# Patient Record
Sex: Male | Born: 1995
Health system: Southern US, Community
[De-identification: ages and names within clinical notes are randomized; demographics above are authoritative.]

## PROBLEM LIST (undated history)

## (undated) DIAGNOSIS — Z8669 Personal history of other diseases of the nervous system and sense organs: Secondary | ICD-10-CM

## (undated) DIAGNOSIS — J189 Pneumonia, unspecified organism: Secondary | ICD-10-CM

## (undated) DIAGNOSIS — K219 Gastro-esophageal reflux disease without esophagitis: Secondary | ICD-10-CM

## (undated) DIAGNOSIS — R519 Headache, unspecified: Secondary | ICD-10-CM

## (undated) DIAGNOSIS — F419 Anxiety disorder, unspecified: Secondary | ICD-10-CM

## (undated) DIAGNOSIS — N200 Calculus of kidney: Secondary | ICD-10-CM

## (undated) DIAGNOSIS — G8929 Other chronic pain: Secondary | ICD-10-CM

## (undated) HISTORY — DX: Calculus of kidney: N20.0

## (undated) HISTORY — DX: Headache, unspecified: R51.9

## (undated) HISTORY — DX: Other chronic pain: G89.29

## (undated) HISTORY — DX: Pneumonia, unspecified organism: J18.9

## (undated) HISTORY — DX: Anxiety disorder, unspecified: F41.9

## (undated) HISTORY — PX: APPENDECTOMY: SHX54

---
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
## 2015-11-23 DIAGNOSIS — M7551 Bursitis of right shoulder: Secondary | ICD-10-CM | POA: Insufficient documentation

## 2016-05-05 DIAGNOSIS — M958 Other specified acquired deformities of musculoskeletal system: Secondary | ICD-10-CM | POA: Insufficient documentation

## 2020-08-28 ENCOUNTER — Emergency Department: Admit: 2020-08-28 | Discharge status: Absent without leave | Payer: Self-pay

## 2020-08-28 ENCOUNTER — Encounter: Payer: Self-pay | Admitting: Emergency Medicine

## 2020-08-28 ENCOUNTER — Emergency Department (INDEPENDENT_AMBULATORY_CARE_PROVIDER_SITE_OTHER)
Admission: EM | Admit: 2020-08-28 | Discharge: 2020-08-28 | Disposition: A | Discharge status: Home / Self Care | Payer: Federal, State, Local not specified - PPO | Source: Home / Self Care

## 2020-08-28 ENCOUNTER — Emergency Department (INDEPENDENT_AMBULATORY_CARE_PROVIDER_SITE_OTHER): Payer: Federal, State, Local not specified - PPO

## 2020-08-28 ENCOUNTER — Other Ambulatory Visit: Payer: Self-pay

## 2020-08-28 DIAGNOSIS — R079 Chest pain, unspecified: Secondary | ICD-10-CM

## 2020-08-28 HISTORY — DX: Personal history of other diseases of the nervous system and sense organs: Z86.69

## 2020-08-28 NOTE — ED Triage Notes (Signed)
Rib pain x 4 weeks  Left side of chest intermittent pain  Unable to describe pain Thought it might be reflux - took OTC ( not sure what it was- helped, but stopped taking it 1 week ago - took yesterday - no relief) TUMS helped heartburn per pt  NO COVID or FLU vaccine

## 2020-08-28 NOTE — ED Provider Notes (Signed)
[facility]    CSN: [id number] Arrival date & time: 08/28/20  1123      History   Chief Complaint Chief Complaint  Patient presents with  . Chest Pain  . Heartburn    HPI Mark Erickson is a 25 y.o. male.   HPI  Mark Erickson is a 25 y.o. male presenting to UC with c/o Left side chest pain that started in his ribs but states it is difficult for him to describe the pain as it has been aching, dull, sharp and burning at different times over the last 4 weeks.  Pain lasts seconds to minutes at a time, radiates into Left arm at times.  Mild relief with OTC reflux medication.  He has taken TUMS, mild relief.  Denies fever, chills, n/v/d. No cough or SOB. No hx of heart problems that he recalls.    Past Medical History:  Diagnosis Date  . History of migraine     Patient Active Problem List   Diagnosis Date Noted  . Winging of scapula 05/05/2016  . Bursitis of right shoulder 11/23/2015  . Umbilical mass 03/07/2013  . Status post laparoscopic appendectomy 02/03/2013  . Attention deficit hyperactivity disorder (ADHD) 01/03/2010    Past Surgical History:  Procedure Laterality Date  . APPENDECTOMY         Home Medications    Prior to Admission medications   Not on File    Family History Family History  Problem Relation Age of Onset  . Healthy Mother   . Healthy Father   . Healthy Sister   . Healthy Brother     Social History Social History   Tobacco Use  . Smoking status: Never Smoker  . Smokeless tobacco: Current User    Types: Chew  . Tobacco comment: 13 years chewing tobacco  Vaping Use  . Vaping Use: Never used  Substance Use Topics  . Alcohol use: Not Currently  . Drug use: Never     Allergies   Patient has no known allergies.   Review of Systems Review of Systems  Constitutional: Negative for chills and fever.  HENT: Negative for congestion, ear pain, sore throat, trouble swallowing and voice change.   Respiratory: Negative  for cough and shortness of breath.   Cardiovascular: Positive for chest pain. Negative for palpitations.  Gastrointestinal: Positive for abdominal pain. Negative for diarrhea, nausea and vomiting.  Musculoskeletal: Positive for myalgias (left upper arm). Negative for arthralgias and back pain.  Skin: Negative for rash.  All other systems reviewed and are negative.    Physical Exam Triage Vital Signs ED Triage Vitals  Enc Vitals Group     BP 08/28/20 1146 136/81     Pulse Rate 08/28/20 1146 71     Resp 08/28/20 1146 16     Temp 08/28/20 1146 99.5 F (37.5 C)     Temp Source 08/28/20 1146 Oral     SpO2 08/28/20 1146 99 %     Weight 08/28/20 1147 143 lb (64.9 kg)     Height 08/28/20 1147 5\' 10"  (1.778 m)     Head Circumference --      Peak Flow --      Pain Score 08/28/20 1148 2     Pain Loc --      Pain Edu? --      Excl. in GC? --    No data found.  Updated Vital Signs BP 136/81 (BP Location: Left Arm)   Pulse 71   Temp  99.5 F (37.5 C) (Oral)   Resp 16   Ht 5\' 10"  (1.778 m)   Wt 143 lb (64.9 kg)   SpO2 99%   BMI 20.52 kg/m   Visual Acuity Right Eye Distance:   Left Eye Distance:   Bilateral Distance:    Right Eye Near:   Left Eye Near:    Bilateral Near:     Physical Exam Vitals and nursing note reviewed.  Constitutional:      General: He is not in acute distress.    Appearance: He is well-developed and well-nourished. He is not ill-appearing, toxic-appearing or diaphoretic.  HENT:     Head: Normocephalic and atraumatic.  Eyes:     Extraocular Movements: EOM normal.  Cardiovascular:     Rate and Rhythm: Normal rate and regular rhythm.  Pulmonary:     Effort: Pulmonary effort is normal.     Breath sounds: No decreased breath sounds, wheezing, rhonchi or rales.  Chest:     Chest wall: No tenderness or crepitus.  Abdominal:     Palpations: Abdomen is soft.     Tenderness: There is no abdominal tenderness.  Musculoskeletal:        General: Normal  range of motion.     Cervical back: Normal range of motion.  Skin:    General: Skin is warm and dry.  Neurological:     Mental Status: He is alert and oriented to person, place, and time.  Psychiatric:        Mood and Affect: Mood and affect normal.        Behavior: Behavior normal.      UC Treatments / Results  Labs (all labs ordered are listed, but only abnormal results are displayed) Labs Reviewed - No data to display  EKG Date/Time:08/28/2020   12:42:33 Ventricular Rate: 61 PR Interval: 178 QRS Duration: 102 QT Interval: 382 QTC Calculation: 384 P-R-T axes: 76   94   79 Text Interpretation: Normal sinus rhythm. Rightward axis. Borderline ECG no prior to compare.    Radiology DG Chest 2 View  Result Date: 08/28/2020 CLINICAL DATA:  Left-sided chest pain. EXAM: CHEST - 2 VIEW COMPARISON:  None. FINDINGS: The heart size and mediastinal contours are within normal limits. Both lungs are clear. The visualized skeletal structures are unremarkable. IMPRESSION: No active cardiopulmonary disease. Electronically Signed   By: 10/26/2020 III M.D   On: 08/28/2020 12:16    Procedures Procedures (including critical care time)  Medications Ordered in UC Medications - No data to display  Initial Impression / Assessment and Plan / UC Course  I have reviewed the triage vital signs and the nursing notes.  Pertinent labs & imaging results that were available during my care of the patient were reviewed by me and considered in my medical decision making (see chart for details).     No evidence of ACS at this time or acute process at this time. Encouraged f/u with PCP  Discussed symptoms that warrant emergent care in the ED.  Final Clinical Impressions(s) / UC Diagnoses   Final diagnoses:  Nonspecific chest pain     Discharge Instructions      You may take 500mg  acetaminophen every 4-6 hours or in combination with ibuprofen 400-600mg  every 6-8 hours as needed for  pain.  Call to schedule an appointment with primary care for ongoing healthcare needs and recheck of symptoms next week.   Call 911 or have someone drive you to the hospital if symptoms significantly  worsening- worsening pain, trouble breathing, unable to keep down fluids, dizziness/passing out, or other new concerning symptoms develop.      ED Prescriptions    None     PDMP not reviewed this encounter.   Lurene Shadow, [place] 08/29/20 1433

## 2020-08-28 NOTE — Discharge Instructions (Signed)
  You may take 500mg  acetaminophen every 4-6 hours or in combination with ibuprofen 400-600mg  every 6-8 hours as needed for pain.  Call to schedule an appointment with primary care for ongoing healthcare needs and recheck of symptoms next week.   Call 911 or have someone drive you to the hospital if symptoms significantly worsening- worsening pain, trouble breathing, unable to keep down fluids, dizziness/passing out, or other new concerning symptoms develop.

## 2020-09-22 ENCOUNTER — Emergency Department
Admission: RE | Admit: 2020-09-22 | Discharge: 2020-09-22 | Disposition: A | Discharge status: Home / Self Care | Payer: Federal, State, Local not specified - PPO | Source: Ambulatory Visit

## 2020-09-22 ENCOUNTER — Encounter: Payer: Self-pay | Admitting: Gastroenterology

## 2020-09-22 ENCOUNTER — Other Ambulatory Visit: Payer: Self-pay

## 2020-09-22 VITALS — BP 126/79 | HR 76 | Temp 98.8°F | Resp 18 | Ht 70.0 in | Wt 135.0 lb

## 2020-09-22 DIAGNOSIS — K2100 Gastro-esophageal reflux disease with esophagitis, without bleeding: Secondary | ICD-10-CM | POA: Diagnosis not present

## 2020-09-22 MED ORDER — FAMOTIDINE 40 MG PO TABS: 40.0000 mg | ORAL_TABLET | Freq: Two times a day (BID) | ORAL | 0 refills | Status: AC

## 2020-09-22 NOTE — Discharge Instructions (Signed)
Schedule follow-up with gastroenterology next available appointment for further work-up and evaluation of your esophagitis and acid reflux symptoms.  Take medication as prescribed as needed for esophagitis symptoms.  I have also included some food choices to help reduce symptoms related to GERD. Also recommend scheduling a new patient appointment for primary care provider.

## 2020-09-22 NOTE — ED Triage Notes (Signed)
Epigastric pain x 5 months, throat is swelling, causing him to gage and vomit. Notices bad reaction to drinking energy drinks and soda. Tries omeprazole but can not remember to take it everyday.

## 2020-09-22 NOTE — ED Provider Notes (Signed)
[facility]    CSN: [id number] Arrival date & time: 09/22/20  0913      History   Chief Complaint Chief Complaint  Patient presents with  . Abdominal Pain    HPI Mark Erickson is a 25 y.o. male.   HPI Patient presents with a concern of epigastric pain ongoing intermittently for over 5 months.  He is also concerned as he has had some dysphagia type symptoms and this is most pronounced after drinking certain beverages.  He has attempted relief with Tums and OTC PPIs without significant relief however endorses inconsistent adherence to taking the medication.  No prior GI follow-up.  At present patient is not followed by PCP.  He does not smoke however he has a long history of chewing.  He reports most of his dysphagia episodes occur with him holding his head backwards or with attempting to lie down.  He has had some vomitus which is a new symptom occurring this week.  No overt abdominal pain mostly epigastric discomfort. Past Medical History:  Diagnosis Date  . History of migraine     Patient Active Problem List   Diagnosis Date Noted  . Winging of scapula 05/05/2016  . Bursitis of right shoulder 11/23/2015  . Umbilical mass 03/07/2013  . Status post laparoscopic appendectomy 02/03/2013  . Attention deficit hyperactivity disorder (ADHD) 01/03/2010    Past Surgical History:  Procedure Laterality Date  . APPENDECTOMY         Home Medications    Prior to Admission medications   Not on File    Family History Family History  Problem Relation Age of Onset  . Healthy Mother   . Healthy Father   . Healthy Sister   . Healthy Brother     Social History Social History   Tobacco Use  . Smoking status: Never Smoker  . Smokeless tobacco: Current User    Types: Chew  . Tobacco comment: 13 years chewing tobacco  Vaping Use  . Vaping Use: Never used  Substance Use Topics  . Alcohol use: Not Currently  . Drug use: Never     Allergies   Patient has no  known allergies.   Review of Systems Review of Systems Pertinent negatives listed in HPI   Physical Exam Triage Vital Signs ED Triage Vitals  Enc Vitals Group     BP 09/22/20 0933 126/79     Pulse Rate 09/22/20 0933 76     Resp 09/22/20 0933 18     Temp 09/22/20 0933 98.8 F (37.1 C)     Temp Source 09/22/20 0933 Oral     SpO2 09/22/20 0933 97 %     Weight 09/22/20 0935 135 lb (61.2 kg)     Height 09/22/20 0935 5\' 10"  (1.778 m)     Head Circumference --      Peak Flow --      Pain Score 09/22/20 0934 7     Pain Loc --      Pain Edu? --      Excl. in GC? --    No data found.  Updated Vital Signs BP 126/79 (BP Location: Right Arm)   Pulse 76   Temp 98.8 F (37.1 C) (Oral)   Resp 18   Ht 5\' 10"  (1.778 m)   Wt 135 lb (61.2 kg)   SpO2 97%   BMI 19.37 kg/m   Visual Acuity Right Eye Distance:   Left Eye Distance:   Bilateral Distance:  Right Eye Near:   Left Eye Near:    Bilateral Near:     Physical Exam General appearance: alert, well developed, well nourished, cooperativeHead: Normocephalic, without obvious abnormality, atraumatic Respiratory: Respirations even and unlabored, normal respiratory rate Heart: rate and rhythm normal. No gallop or murmurs noted on exam  Abdomen: BS +, no distention, no rebound tenderness, or no mass Extremities: No gross deformities Skin: Skin color, texture, turgor normal. No rashes seen  Psych: Appropriate mood and affect. Neurologic: Alert, oriented to person, place, and time, thought content appropriate.  UC Treatments / Results  Labs (all labs ordered are listed, but only abnormal results are displayed) Labs Reviewed - No data to display  EKG   Radiology No results found.  Procedures Procedures (including critical care time)  Medications Ordered in UC Medications - No data to display  Initial Impression / Assessment and Plan / UC Course  I have reviewed the triage vital signs and the nursing  notes.  Pertinent labs & imaging results that were available during my care of the patient were reviewed by me and considered in my medical decision making (see chart for details).    Patient with a history of recurrent ongoing esophagitis and GERD symptoms. Has not consistently adhere to PPI therapy given new symptoms of worsening esophagitis however will trial as needed famotidine 40 mg twice daily until esophagitis symptoms resolve.  Patient given 2 referrals to follow-up with gastroenterology within the [facility] and in a GI office here locally in [place] as patient is unaware of who his insurance permits him to see.  Patient will follow up with both and schedule appointment with whichever provider is available to see him first. Information given regarding foods to avoid to prevent recurrence of symptoms. Final Clinical Impressions(s) / UC Diagnoses   Final diagnoses:  Gastroesophageal reflux disease with esophagitis without hemorrhage     Discharge Instructions     Schedule follow-up with gastroenterology next available appointment for further work-up and evaluation of your esophagitis and acid reflux symptoms.  Take medication as prescribed as needed for esophagitis symptoms.  I have also included some food choices to help reduce symptoms related to GERD. Also recommend scheduling a new patient appointment for primary care provider.    ED Prescriptions    Medication Sig Dispense Auth. Provider   famotidine (PEPCID) 40 MG tablet Take 1 tablet (40 mg total) by mouth 2 (two) times daily. 60 tablet Bing Neighbors, FNP     PDMP not reviewed this encounter.   Bing Neighbors, FNP 09/22/20 1019

## 2020-10-07 ENCOUNTER — Ambulatory Visit: Payer: Federal, State, Local not specified - PPO | Admitting: Gastroenterology

## 2020-10-07 ENCOUNTER — Encounter: Payer: Self-pay | Admitting: Gastroenterology

## 2020-10-07 VITALS — BP 108/64 | HR 90 | Ht 70.0 in | Wt 138.1 lb

## 2020-10-07 DIAGNOSIS — R12 Heartburn: Secondary | ICD-10-CM | POA: Diagnosis not present

## 2020-10-07 DIAGNOSIS — R1013 Epigastric pain: Secondary | ICD-10-CM | POA: Diagnosis not present

## 2020-10-07 DIAGNOSIS — K219 Gastro-esophageal reflux disease without esophagitis: Secondary | ICD-10-CM

## 2020-10-07 NOTE — Patient Instructions (Signed)
If you are age 26 or older, your body mass index should be between 23-30. Your Body mass index is 19.82 kg/m. If this is out of the aforementioned range listed, please consider follow up with your Primary Care Provider.  If you are age 25 or younger, your body mass index should be between 19-25. Your Body mass index is 19.82 kg/m. If this is out of the aformentioned range listed, please consider follow up with your Primary Care Provider.   Continue with Pepcid twice daily for 4-6 weeks then decrease to once daily   Gastroesophageal Reflux Disease, Adult  Gastroesophageal reflux (GER) happens when acid from the stomach flows up into the tube that connects the mouth and the stomach (esophagus). Normally, food travels down the esophagus and stays in the stomach to be digested. With GER, food and stomach acid sometimes move back up into the esophagus. You may have a disease called gastroesophageal reflux disease (GERD) if the reflux:  Happens often.  Causes frequent or very bad symptoms.  Causes problems such as damage to the esophagus. When this happens, the esophagus becomes sore and swollen. Over time, GERD can make small holes (ulcers) in the lining of the esophagus. What are the causes? This condition is caused by a problem with the muscle between the esophagus and the stomach. When this muscle is weak or not normal, it does not close properly to keep food and acid from coming back up from the stomach. The muscle can be weak because of:  Tobacco use.  Pregnancy.  Having a certain type of hernia (hiatal hernia).  Alcohol use.  Certain foods and drinks, such as coffee, chocolate, onions, and peppermint. What increases the risk?  Being overweight.  Having a disease that affects your connective tissue.  Taking NSAIDs, such a ibuprofen. What are the signs or symptoms?  Heartburn.  Difficult or painful swallowing.  The feeling of having a lump in the throat.  A bitter taste  in the mouth.  Bad breath.  Having a lot of saliva.  Having an upset or bloated stomach.  Burping.  Chest pain. Different conditions can cause chest pain. Make sure you see your doctor if you have chest pain.  Shortness of breath or wheezing.  A long-term cough or a cough at night.  Wearing away of the surface of teeth (tooth enamel).  Weight loss. How is this treated?  Making changes to your diet.  Taking medicine.  Having surgery. Treatment will depend on how bad your symptoms are. Follow these instructions at home: Eating and drinking  Follow a diet as told by your doctor. You may need to avoid foods and drinks such as: ? Coffee and tea, with or without caffeine. ? Drinks that contain alcohol. ? Energy drinks and sports drinks. ? Bubbly (carbonated) drinks or sodas. ? Chocolate and cocoa. ? Peppermint and mint flavorings. ? Garlic and onions. ? Horseradish. ? Spicy and acidic foods. These include peppers, chili powder, curry powder, vinegar, hot sauces, and BBQ sauce. ? Citrus fruit juices and citrus fruits, such as oranges, lemons, and limes. ? Tomato-based foods. These include red sauce, chili, salsa, and pizza with red sauce. ? Fried and fatty foods. These include donuts, french fries, potato chips, and high-fat dressings. ? High-fat meats. These include hot dogs, rib eye steak, sausage, ham, and bacon. ? High-fat dairy items, such as whole milk, butter, and cream cheese.  Eat small meals often. Avoid eating large meals.  Avoid drinking large amounts of  liquid with your meals.  Avoid eating meals during the 2-3 hours before bedtime.  Avoid lying down right after you eat.  Do not exercise right after you eat.   Lifestyle  Do not smoke or use any products that contain nicotine or tobacco. If you need help quitting, ask your doctor.  Try to lower your stress. If you need help doing this, ask your doctor.  If you are overweight, lose an amount of weight  that is healthy for you. Ask your doctor about a safe weight loss goal.   General instructions  Pay attention to any changes in your symptoms.  Take over-the-counter and prescription medicines only as told by your doctor.  Do not take aspirin, ibuprofen, or other NSAIDs unless your doctor says it is okay.  Wear loose clothes. Do not wear anything tight around your waist.  Raise (elevate) the head of your bed about 6 inches (15 cm). You may need to use a wedge to do this.  Avoid bending over if this makes your symptoms worse.  Keep all follow-up visits. Contact a doctor if:  You have new symptoms.  You lose weight and you do not know why.  You have trouble swallowing or it hurts to swallow.  You have wheezing or a cough that keeps happening.  You have a hoarse voice.  Your symptoms do not get better with treatment. Get help right away if:  You have sudden pain in your arms, neck, jaw, teeth, or back.  You suddenly feel sweaty, dizzy, or light-headed.  You have chest pain or shortness of breath.  You vomit and the vomit is green, yellow, or black, or it looks like blood or coffee grounds.  You faint.  Your poop (stool) is red, bloody, or black.  You cannot swallow, drink, or eat. These symptoms may represent a serious problem that is an emergency. Do not wait to see if the symptoms will go away. Get medical help right away. Call your local emergency services (911 in the [place] Do not drive yourself to the hospital. Summary  If a person has gastroesophageal reflux disease (GERD), food and stomach acid move back up into the esophagus and cause symptoms or problems such as damage to the esophagus.  Treatment will depend on how bad your symptoms are.  Follow a diet as told by your doctor.  Take all medicines only as told by your doctor. This information is not intended to replace advice given to you by your health care provider. Make sure you discuss any questions you  have with your health care provider. Document Revised: 02/16/2020 Document Reviewed: 02/16/2020 Elsevier Patient Education  2021 Elsevier Inc.  Food Choices for Gastroesophageal Reflux Disease, Adult When you have gastroesophageal reflux disease (GERD), the foods you eat and your eating habits are very important. Choosing the right foods can help ease your discomfort. Think about working with a food expert (dietitian) to help you make good choices. What are tips for following this plan? Reading food labels  Look for foods that are low in saturated fat. Foods that may help with your symptoms include: ? Foods that have less than 5% of daily value (DV) of fat. ? Foods that have 0 grams of trans fat. Cooking  Do not fry your food.  Cook your food by baking, steaming, grilling, or broiling. These are all methods that do not need a lot of fat for cooking.  To add flavor, try to use herbs that are low in  spice and acidity. Meal planning  Choose healthy foods that are low in fat, such as: ? Fruits and vegetables. ? Whole grains. ? Low-fat dairy products. ? Lean meats, fish, and poultry.  Eat small meals often instead of eating 3 large meals each day. Eat your meals slowly in a place where you are relaxed. Avoid bending over or lying down until 2-3 hours after eating.  Limit high-fat foods such as fatty meats or fried foods.  Limit your intake of fatty foods, such as oils, butter, and shortening.  Avoid the following as told by your doctor: ? Foods that cause symptoms. These may be different for different people. Keep a food diary to keep track of foods that cause symptoms. ? Alcohol. ? Drinking a lot of liquid with meals. ? Eating meals during the 2-3 hours before bed.   Lifestyle  Stay at a healthy weight. Ask your doctor what weight is healthy for you. If you need to lose weight, work with your doctor to do so safely.  Exercise for at least 30 minutes on 5 or more days each week,  or as told by your doctor.  Wear loose-fitting clothes.  Do not smoke or use any products that contain nicotine or tobacco. If you need help quitting, ask your doctor.  Sleep with the head of your bed higher than your feet. Use a wedge under the mattress or blocks under the bed frame to raise the head of the bed.  Chew sugar-free gum after meals. What foods should eat? Eat a healthy, well-balanced diet of fruits, vegetables, whole grains, low-fat dairy products, lean meats, fish, and poultry. Each person is different. Foods that may cause symptoms in one person may not cause any symptoms in another person. Work with your doctor to find foods that are safe for you. The items listed above may not be a complete list of what you can eat and drink. Contact a food expert for more options.   What foods should I avoid? Limiting some of these foods may help in managing the symptoms of GERD. Everyone is different. Talk with a food expert or your doctor to help you find the exact foods to avoid, if any. Fruits Any fruits prepared with added fat. Any fruits that cause symptoms. For some people, this may include citrus fruits, such as oranges, grapefruit, pineapple, and lemons. Vegetables Deep-fried vegetables. [place] fries. Any vegetables prepared with added fat. Any vegetables that cause symptoms. For some people, this may include tomatoes and tomato products, chili peppers, onions and garlic, and horseradish. Grains Pastries or quick breads with added fat. Meats and other proteins High-fat meats, such as fatty beef or pork, hot dogs, ribs, ham, sausage, salami, and bacon. Fried meat or protein, including fried fish and fried chicken. Nuts and nut butters, in large amounts. Dairy Whole milk and chocolate milk. Sour cream. Cream. Ice cream. Cream cheese. Milkshakes. Fats and oils Butter. Margarine. Shortening. Ghee. Beverages Coffee and tea, with or without caffeine. Carbonated beverages. Sodas.  Energy drinks. Fruit juice made with acidic fruits, such as orange or grapefruit. Tomato juice. Alcoholic drinks. Sweets and desserts Chocolate and cocoa. Donuts. Seasonings and condiments Pepper. Peppermint and spearmint. Added salt. Any condiments, herbs, or seasonings that cause symptoms. For some people, this may include curry, hot sauce, or vinegar-based salad dressings. The items listed above may not be a complete list of what you should not eat and drink. Contact a food expert for more options. Questions to ask your  doctor Diet and lifestyle changes are often the first steps that are taken to manage symptoms of GERD. If diet and lifestyle changes do not help, talk with your doctor about taking medicines. Where to find more information  International Foundation for Gastrointestinal Disorders: aboutgerd.org Summary  When you have GERD, food and lifestyle choices are very important in easing your symptoms.  Eat small meals often instead of 3 large meals a day. Eat your meals slowly and in a place where you are relaxed.  Avoid bending over or lying down until 2-3 hours after eating.  Limit high-fat foods such as fatty meats or fried foods. This information is not intended to replace advice given to you by your health care provider. Make sure you discuss any questions you have with your health care provider. Document Revised: 02/16/2020 Document Reviewed: 02/16/2020 Elsevier Patient Education  2021 [employer]   Due to recent changes in healthcare laws, you may see the results of your imaging and laboratory studies on MyChart before your provider has had a chance to review them.  We understand that in some cases there may be results that are confusing or concerning to you. Not all laboratory results come back in the same time frame and the provider may be waiting for multiple results in order to interpret others.  Please give us 48 hours in order for your provider to thoroughly review  all the results before contacting the office for clarification of your results.   Thank you for choosing me and [facility].  Doristine LocksVito Cirigliano, D.O.  Please call to schedule a return appointment in 3 months.

## 2020-10-07 NOTE — Progress Notes (Signed)
Chief Complaint: chest pain, epigastric pain, GERD  Referring Provider:     Bing Neighbors, FNP   HPI:     Mark Erickson is a 25 y.o. male referred to the [facility] for evaluation of chest pain and epigastric pain with concern for reflux.  Has been having intermittent epigastric pain and chest pain for the last 5+ months.  Rare heartburn and no regurgitation.  Does have intermittent globus sensation, pointing to suprasternal notch.  No frank dysphagia or odynophagia.  Mild relief with trial of Tums and OTC Nexium. Also anytime he extends his neck will have gagging and n/v.   Seen in [facility] 08/28/20 for chest pain and heartburn.  EKG unremarkable.  Normal CXR.  Was seen again in the [facility] on 09/22/2020 with ongoing epigastric pain.  Work-up was unremarkable.  Prescribed famotidine 40 mg bid with GI referral.  He returns to [facility] on 09/29/2020 with nephrolithiasis.  Normal CBC, CMP.  CT abdomen pelvis with 2 mm left ureteral stone causing left hydronephrosis and perinephric stranding.  Otherwise normal appearing GI tract, liver, GB, pancreas. Discharged with Flomax, Zofran, Norco, and Urology follow-up.   Today, he states symptoms are overall improving since starting famotidine.  Tolerating p.o. intake.  No abdominal pain, change in bowel habits, hematochezia, melena.  No known family history of CRC, GI malignancy, liver disease, pancreatic disease, or IBD.   No prior EGD/colonoscopy.    Past Medical History:  Diagnosis Date  . Anxiety   . Chronic headache   . History of migraine   . Kidney stone   . Pneumonia      Past Surgical History:  Procedure Laterality Date  . APPENDECTOMY     Family History  Problem Relation Age of Onset  . Healthy Mother   . Healthy Father   . Healthy Sister   . Healthy Brother   . Colon cancer Neg Hx   . Esophageal cancer Neg Hx    Social History   Tobacco Use  . Smoking  status: Never Smoker  . Smokeless tobacco: Current User    Types: Chew  . Tobacco comment: 13 years chewing tobacco  Vaping Use  . Vaping Use: Never used  Substance Use Topics  . Alcohol use: Not Currently  . Drug use: Never   Current Outpatient Medications  Medication Sig Dispense Refill  . famotidine (PEPCID) 40 MG tablet Take 1 tablet (40 mg total) by mouth 2 (two) times daily. 60 tablet 0   No current facility-administered medications for this visit.   No Known Allergies   Review of Systems: All systems reviewed and negative except where noted in HPI.     Physical Exam:    Wt Readings from Last 3 Encounters:  10/07/20 138 lb 2 oz (62.7 kg)  09/22/20 135 lb (61.2 kg)  08/28/20 143 lb (64.9 kg)    BP 108/64   Pulse 90   Ht 5\' 10"  (1.778 m)   Wt 138 lb 2 oz (62.7 kg)   BMI 19.82 kg/m  Constitutional:  Pleasant, in no acute distress. Psychiatric: Normal mood and affect. Behavior is normal. EENT: Pupils normal.  Conjunctivae are normal. No scleral icterus. Neck supple. No cervical LAD. Cardiovascular: Normal rate, regular rhythm. No edema Pulmonary/chest: Effort normal and breath sounds normal. No wheezing, rales or rhonchi. Abdominal: Soft, nondistended, nontender. Bowel sounds active throughout. There are no masses palpable. No  hepatomegaly. Neurological: Alert and oriented to person place and time. Skin: Skin is warm and dry. No rashes noted.   ASSESSMENT AND PLAN;   1) Epigastric pain 2) Noncardiac chest pain 3) Heartburn  Constellation of symptoms and response to H2 RA most suspicious for GERD.  Discussed full DDx at length today along with additional diagnostic options, to include risks and benefits of EGD, along with labs or advanced imaging.  Given good response to therapy, he would like to continue with trial of medical management.  -Continue Pepcid BID x4-6 weeks.  If symptoms well controlled, can start to taper to daily and hopefully to  prn -Discussed antireflux lifestyle/dietary modifications and provided with handout with detailed instruction -If symptoms do not abate or if additional concerns, plan for EGD -Additionally recommended that he establish with a primary care   RTC 3 months or sooner as needed    Shellia Cleverly, DO, FACG  10/07/2020, 2:19 PM   Bing Neighbors, FNP

## 2020-10-19 ENCOUNTER — Other Ambulatory Visit: Payer: Self-pay | Admitting: Family Medicine

## 2021-03-05 ENCOUNTER — Other Ambulatory Visit: Payer: Self-pay

## 2021-03-05 ENCOUNTER — Emergency Department
Admission: RE | Admit: 2021-03-05 | Discharge: 2021-03-05 | Disposition: A | Discharge status: Home / Self Care | Payer: Federal, State, Local not specified - PPO | Source: Ambulatory Visit

## 2021-03-05 VITALS — BP 121/71 | HR 71 | Temp 98.4°F | Resp 16 | Ht 70.0 in | Wt 135.0 lb

## 2021-03-05 DIAGNOSIS — S46911A Strain of unspecified muscle, fascia and tendon at shoulder and upper arm level, right arm, initial encounter: Secondary | ICD-10-CM

## 2021-03-05 DIAGNOSIS — M25511 Pain in right shoulder: Secondary | ICD-10-CM

## 2021-03-05 HISTORY — DX: Gastro-esophageal reflux disease without esophagitis: K21.9

## 2021-03-05 MED ORDER — PREDNISONE 20 MG PO TABS: ORAL_TABLET | ORAL | 0 refills | Status: DC

## 2021-03-05 NOTE — Discharge Instructions (Addendum)
Advised/instructed patient to take medication as directed with food to completion.  Encourage patient to avoid overhead lifting, moderately strenuous, and/or repetitive motion activities for the next 7 to 10 days.  Advised patient to follow-up with orthopedic provider on Monday, 03/07/2021 for further evaluation.  Encourage patient increase daily water intake while taking these medications.

## 2021-03-05 NOTE — ED Triage Notes (Signed)
Patient here day 3 of having numbness and pain intermittently in right arm; shoulder to wrist. Does have known right rotator cuff tear from 5 years ago with occasional numbness issues. Three days ago reached into machine with exaggerated stretch to get something and these new symptoms have since exacerbated; last night bought wrist brace for right wrist because it was difficult to open door, etc. No covid vaccinations.

## 2021-03-05 NOTE — ED Provider Notes (Signed)
[facility]    CSN: [id number] Arrival date & time: 03/05/21  1311      History   Chief Complaint Chief Complaint  Patient presents with   Extremity Weakness    HPI Mark Erickson is a 25 y.o. male.   HPI 25 year old male presents with 3 days of right upper arm numbness pain from right shoulder to right wrist.  Reports symptoms have worsened since last night.  Patient reports right rotator cuff tear 5 years ago with occasional numbness symptoms.  Past Medical History:  Diagnosis Date   Acid reflux    Anxiety    Chronic headache    History of migraine    Kidney stone    Pneumonia     Patient Active Problem List   Diagnosis Date Noted   Winging of scapula 05/05/2016   Bursitis of right shoulder 11/23/2015   Umbilical mass 03/07/2013   Status post laparoscopic appendectomy 02/03/2013   Attention deficit hyperactivity disorder (ADHD) 01/03/2010    Past Surgical History:  Procedure Laterality Date   APPENDECTOMY         Home Medications    Prior to Admission medications   Medication Sig Start Date End Date Taking? Authorizing Provider  predniSONE (DELTASONE) 20 MG tablet Take 3 tabs PO daily x 3 days, then 2 tabs PO daily x 3 days, then 1 tab PO daily x 3 days 03/05/21  Yes Trevor Iha, FNP  famotidine (PEPCID) 40 MG tablet Take 1 tablet (40 mg total) by mouth 2 (two) times daily. 09/22/20 10/22/20  Bing Neighbors, FNP    Family History Family History  Problem Relation Age of Onset   Healthy Mother    Healthy Father    Healthy Sister    Healthy Brother    Colon cancer Neg Hx    Esophageal cancer Neg Hx     Social History Social History   Tobacco Use   Smoking status: Never   Smokeless tobacco: Current    Types: Chew   Tobacco comments:    13 years chewing tobacco  Vaping Use   Vaping Use: Never used  Substance Use Topics   Alcohol use: Not Currently   Drug use: Never     Allergies   Patient has no known  allergies.   Review of Systems Review of Systems  Musculoskeletal:        Right shoulder pain/numbness x 3 days    Physical Exam Triage Vital Signs ED Triage Vitals  Enc Vitals Group     BP 03/05/21 1332 121/71     Pulse Rate 03/05/21 1332 71     Resp 03/05/21 1332 16     Temp 03/05/21 1332 98.4 F (36.9 C)     Temp Source 03/05/21 1332 Oral     SpO2 03/05/21 1332 97 %     Weight 03/05/21 1333 135 lb (61.2 kg)     Height 03/05/21 1333 5\' 10"  (1.778 m)     Head Circumference --      Peak Flow --      Pain Score 03/05/21 1333 5     Pain Loc --      Pain Edu? --      Excl. in GC? --    No data found.  Updated Vital Signs BP 121/71 (BP Location: Left Arm)   Pulse 71   Temp 98.4 F (36.9 C) (Oral)   Resp 16   Ht 5\' 10"  (1.778 m)   Wt 135 lb (  61.2 kg)   SpO2 97%   BMI 19.37 kg/m      Physical Exam Vitals and nursing note reviewed.  Constitutional:      General: He is not in acute distress.    Appearance: Normal appearance. He is normal weight. He is not ill-appearing.  HENT:     Head: Normocephalic and atraumatic.     Mouth/Throat:     Mouth: Mucous membranes are moist.     Pharynx: Oropharynx is clear.  Eyes:     Extraocular Movements: Extraocular movements intact.     Conjunctiva/sclera: Conjunctivae normal.     Pupils: Pupils are equal, round, and reactive to light.  Cardiovascular:     Rate and Rhythm: Normal rate and regular rhythm.     Pulses: Normal pulses.     Heart sounds: Normal heart sounds.  Pulmonary:     Effort: Pulmonary effort is normal.     Breath sounds: Normal breath sounds.  Musculoskeletal:     Cervical back: Normal range of motion and neck supple.     Comments: Right shoulder: Mildly TTP over lateral aspect of GH and superior wing of scapula,  FROM flexion, extension, abduction, abduction, internal/external rotation, and horizontal abduction/adduction; during this exam patient reports numbness from [facility] joint to right wrist.  Skin:     General: Skin is warm and dry.  Neurological:     General: No focal deficit present.     Mental Status: He is alert and oriented to person, place, and time.  Psychiatric:        Mood and Affect: Mood normal.        Behavior: Behavior normal.        Judgment: Judgment normal.     UC Treatments / Results  Labs (all labs ordered are listed, but only abnormal results are displayed) Labs Reviewed - No data to display  EKG   Radiology No results found.  Procedures Procedures (including critical care time)  Medications Ordered in UC Medications - No data to display  Initial Impression / Assessment and Plan / UC Course  I have reviewed the triage vital signs and the nursing notes.  Pertinent labs & imaging results that were available during my care of the patient were reviewed by me and considered in my medical decision making (see chart for details).     MDM: 1.  Right shoulder pain/numbness-Advised follow-up with [facility] orthopedic provider on Monday, 03/07/2021, 2.  Strain of right shoulder-Rx'd Prednisone taper.  Patient discharged home, hemodynamically stable. Final Clinical Impressions(s) / UC Diagnoses   Final diagnoses:  Acute pain of right shoulder  Strain of right shoulder, initial encounter     Discharge Instructions      Advised/instructed patient to take medication as directed with food to completion.  Encourage patient to avoid overhead lifting, moderately strenuous, and/or repetitive motion activities for the next 7 to 10 days.  Advised patient to follow-up with orthopedic provider on Monday, 03/07/2021 for further evaluation.  Encourage patient increase daily water intake while taking these medications.     ED Prescriptions     Medication Sig Dispense Auth. Provider   predniSONE (DELTASONE) 20 MG tablet Take 3 tabs PO daily x 3 days, then 2 tabs PO daily x 3 days, then 1 tab PO daily x 3 days 18 tablet Trevor Iha, FNP      PDMP not reviewed this  encounter.   Trevor Iha, FNP 03/05/21 1419

## 2021-06-22 ENCOUNTER — Other Ambulatory Visit: Payer: Self-pay

## 2021-06-22 ENCOUNTER — Emergency Department (INDEPENDENT_AMBULATORY_CARE_PROVIDER_SITE_OTHER)
Admission: EM | Admit: 2021-06-22 | Discharge: 2021-06-22 | Disposition: A | Discharge status: Home / Self Care | Payer: Federal, State, Local not specified - PPO | Source: Home / Self Care | Attending: Family Medicine | Admitting: Family Medicine

## 2021-06-22 ENCOUNTER — Ambulatory Visit: Payer: Self-pay

## 2021-06-22 DIAGNOSIS — Z87442 Personal history of urinary calculi: Secondary | ICD-10-CM

## 2021-06-22 DIAGNOSIS — R3129 Other microscopic hematuria: Secondary | ICD-10-CM

## 2021-06-22 DIAGNOSIS — R109 Unspecified abdominal pain: Secondary | ICD-10-CM | POA: Diagnosis not present

## 2021-06-22 LAB — POCT URINALYSIS DIP (MANUAL ENTRY)
Bilirubin, UA: NEGATIVE
Glucose, UA: NEGATIVE mg/dL
Ketones, POC UA: NEGATIVE mg/dL
Leukocytes, UA: NEGATIVE
Nitrite, UA: NEGATIVE
Protein Ur, POC: NEGATIVE mg/dL
Spec Grav, UA: 1.015 (ref 1.010–1.025)
Urobilinogen, UA: 0.2 E.U./dL
pH, UA: 6 (ref 5.0–8.0)

## 2021-06-22 MED ORDER — IBUPROFEN 800 MG PO TABS: 800.0000 mg | ORAL_TABLET | Freq: Three times a day (TID) | ORAL | 0 refills | Status: AC

## 2021-06-22 MED ORDER — TAMSULOSIN HCL 0.4 MG PO CAPS: 0.4000 mg | ORAL_CAPSULE | Freq: Every day | ORAL | 1 refills | Status: AC

## 2021-06-22 NOTE — Discharge Instructions (Signed)
Drink lots of water Take ibuprofen 3 times a day for pain.  Take with food Take the Flomax once a day until pain is improved See your doctor if not improving by next week.  Go to the emergency room if you become much worse instead of better

## 2021-06-22 NOTE — ED Triage Notes (Addendum)
Pt c/o intermittent abd pain x 3 weeks bilaterally that radiates around to his lower back. Worse in the last 2 days. Was rx'd  pepcid in Feb. Rx has ran out. Tums prn. Also has hx of kidney stones.

## 2021-06-23 NOTE — ED Provider Notes (Signed)
[facility]    CSN: [id number] Arrival date & time: 06/22/21  1149      History   Chief Complaint Chief Complaint  Patient presents with   Back Pain    Lower   Abdominal Pain    HPI Mark Erickson is a 25 y.o. male.   HPI  Patient is here for flank pain that radiates around to his abdomen.  He does have a history of kidney stones.  He has had no fever or chills.  He has no hematuria.  He states sometimes it hurts into his lower abdomen.  He states that he was prescribed Pepcid in February but he ran out and has not gone to the pharmacy to get anymore.  He takes Tums as needed for epigastric discomfort.  No fever or chills.  No nausea vomiting  Past Medical History:  Diagnosis Date   Acid reflux    Anxiety    Chronic headache    History of migraine    Kidney stone    Pneumonia     Patient Active Problem List   Diagnosis Date Noted   Winging of scapula 05/05/2016   Bursitis of right shoulder 123XX123   Umbilical mass 123XX123   Status post laparoscopic appendectomy 02/03/2013   Attention deficit hyperactivity disorder (ADHD) 01/03/2010    Past Surgical History:  Procedure Laterality Date   APPENDECTOMY         Home Medications    Prior to Admission medications   Medication Sig Start Date End Date Taking? Authorizing Provider  ibuprofen (ADVIL) 800 MG tablet Take 1 tablet (800 mg total) by mouth 3 (three) times daily. 06/22/21  Yes Raylene Everts, MD  tamsulosin (FLOMAX) 0.4 MG CAPS capsule Take 1 capsule (0.4 mg total) by mouth daily after supper. 06/22/21  Yes Raylene Everts, MD  famotidine (PEPCID) 40 MG tablet Take 1 tablet (40 mg total) by mouth 2 (two) times daily. 09/22/20 10/22/20  Scot Jun, FNP    Family History Family History  Problem Relation Age of Onset   Healthy Mother    Healthy Father    Healthy Sister    Healthy Brother    Colon cancer Neg Hx    Esophageal cancer Neg Hx     Social History Social  History   Tobacco Use   Smoking status: Never   Smokeless tobacco: Current    Types: Chew   Tobacco comments:    13 years chewing tobacco  Vaping Use   Vaping Use: Never used  Substance Use Topics   Alcohol use: Not Currently   Drug use: Never     Allergies   Patient has no known allergies.   Review of Systems Review of Systems  See HPI Physical Exam Triage Vital Signs ED Triage Vitals  Enc Vitals Group     BP 06/22/21 1205 135/73     Pulse Rate 06/22/21 1205 64     Resp 06/22/21 1205 17     Temp 06/22/21 1205 98.1 F (36.7 C)     Temp Source 06/22/21 1205 Oral     SpO2 06/22/21 1205 99 %     Weight --      Height --      Head Circumference --      Peak Flow --      Pain Score 06/22/21 1206 2     Pain Loc --      Pain Edu? --      Excl. in  GC? --    No data found.  Updated Vital Signs BP 135/73 (BP Location: Right Arm)   Pulse 64   Temp 98.1 F (36.7 C) (Oral)   Resp 17   SpO2 99%      Physical Exam Constitutional:      General: He is not in acute distress.    Appearance: He is well-developed. He is not ill-appearing.  HENT:     Head: Normocephalic and atraumatic.  Eyes:     Conjunctiva/sclera: Conjunctivae normal.     Pupils: Pupils are equal, round, and reactive to light.  Cardiovascular:     Rate and Rhythm: Normal rate and regular rhythm.     Heart sounds: Normal heart sounds.  Pulmonary:     Effort: Pulmonary effort is normal. No respiratory distress.     Breath sounds: Normal breath sounds.  Chest:     Chest wall: No tenderness.  Abdominal:     General: There is no distension.     Palpations: Abdomen is soft.     Tenderness: There is no abdominal tenderness. There is no right CVA tenderness or left CVA tenderness.     Comments: Patient appears comfortable.  He has no CVA tenderness.  He has no tenderness to palpation of the lumbar muscles or bony prominences.  He has no tenderness to palpation in the abdomen.  No guarding or rebound.   No organomegaly.  Musculoskeletal:        General: Normal range of motion.     Cervical back: Normal range of motion.  Skin:    General: Skin is warm and dry.  Neurological:     Mental Status: He is alert.     Sensory: No sensory deficit.     Motor: No weakness.     Gait: Gait normal.     Deep Tendon Reflexes: Reflexes normal.  Psychiatric:        Mood and Affect: Mood normal.        Behavior: Behavior normal.     UC Treatments / Results  Labs (all labs ordered are listed, but only abnormal results are displayed) Labs Reviewed  POCT URINALYSIS DIP (MANUAL ENTRY) - Abnormal; Notable for the following components:      Result Value   Blood, UA trace-lysed (*)    All other components within normal limits  URINE CULTURE    EKG   Radiology No results found.  Procedures Procedures (including critical care time)  Medications Ordered in UC Medications - No data to display  Initial Impression / Assessment and Plan / UC Course  I have reviewed the triage vital signs and the nursing notes.  Pertinent labs & imaging results that were available during my care of the patient were reviewed by me and considered in my medical decision making (see chart for details).     Patient does have a trace hematuria.  This with his history of kidney stones and flank pain makes kidney stone a possibility.  He does not have physical exam findings to suggest musculoskeletal pain Final Clinical Impressions(s) / UC Diagnoses   Final diagnoses:  Flank pain  Microscopic hematuria  History of kidney stones     Discharge Instructions      Drink lots of water Take ibuprofen 3 times a day for pain.  Take with food Take the Flomax once a day until pain is improved See your doctor if not improving by next week.  Go to the emergency room if you become much  worse instead of better   ED Prescriptions     Medication Sig Dispense Auth. Provider   ibuprofen (ADVIL) 800 MG tablet Take 1 tablet  (800 mg total) by mouth 3 (three) times daily. 21 tablet Raylene Everts, MD   tamsulosin (FLOMAX) 0.4 MG CAPS capsule Take 1 capsule (0.4 mg total) by mouth daily after supper. 7 capsule Raylene Everts, MD      PDMP not reviewed this encounter.   Raylene Everts, MD 06/23/21 2053

## 2022-04-06 IMAGING — DX DG CHEST 2V
2 series · 2 of 2 positions shown · non-contrast
Comparison: None.

CLINICAL DATA: Left-sided chest pain.

EXAM:
CHEST - 2 VIEW

[chest pa]
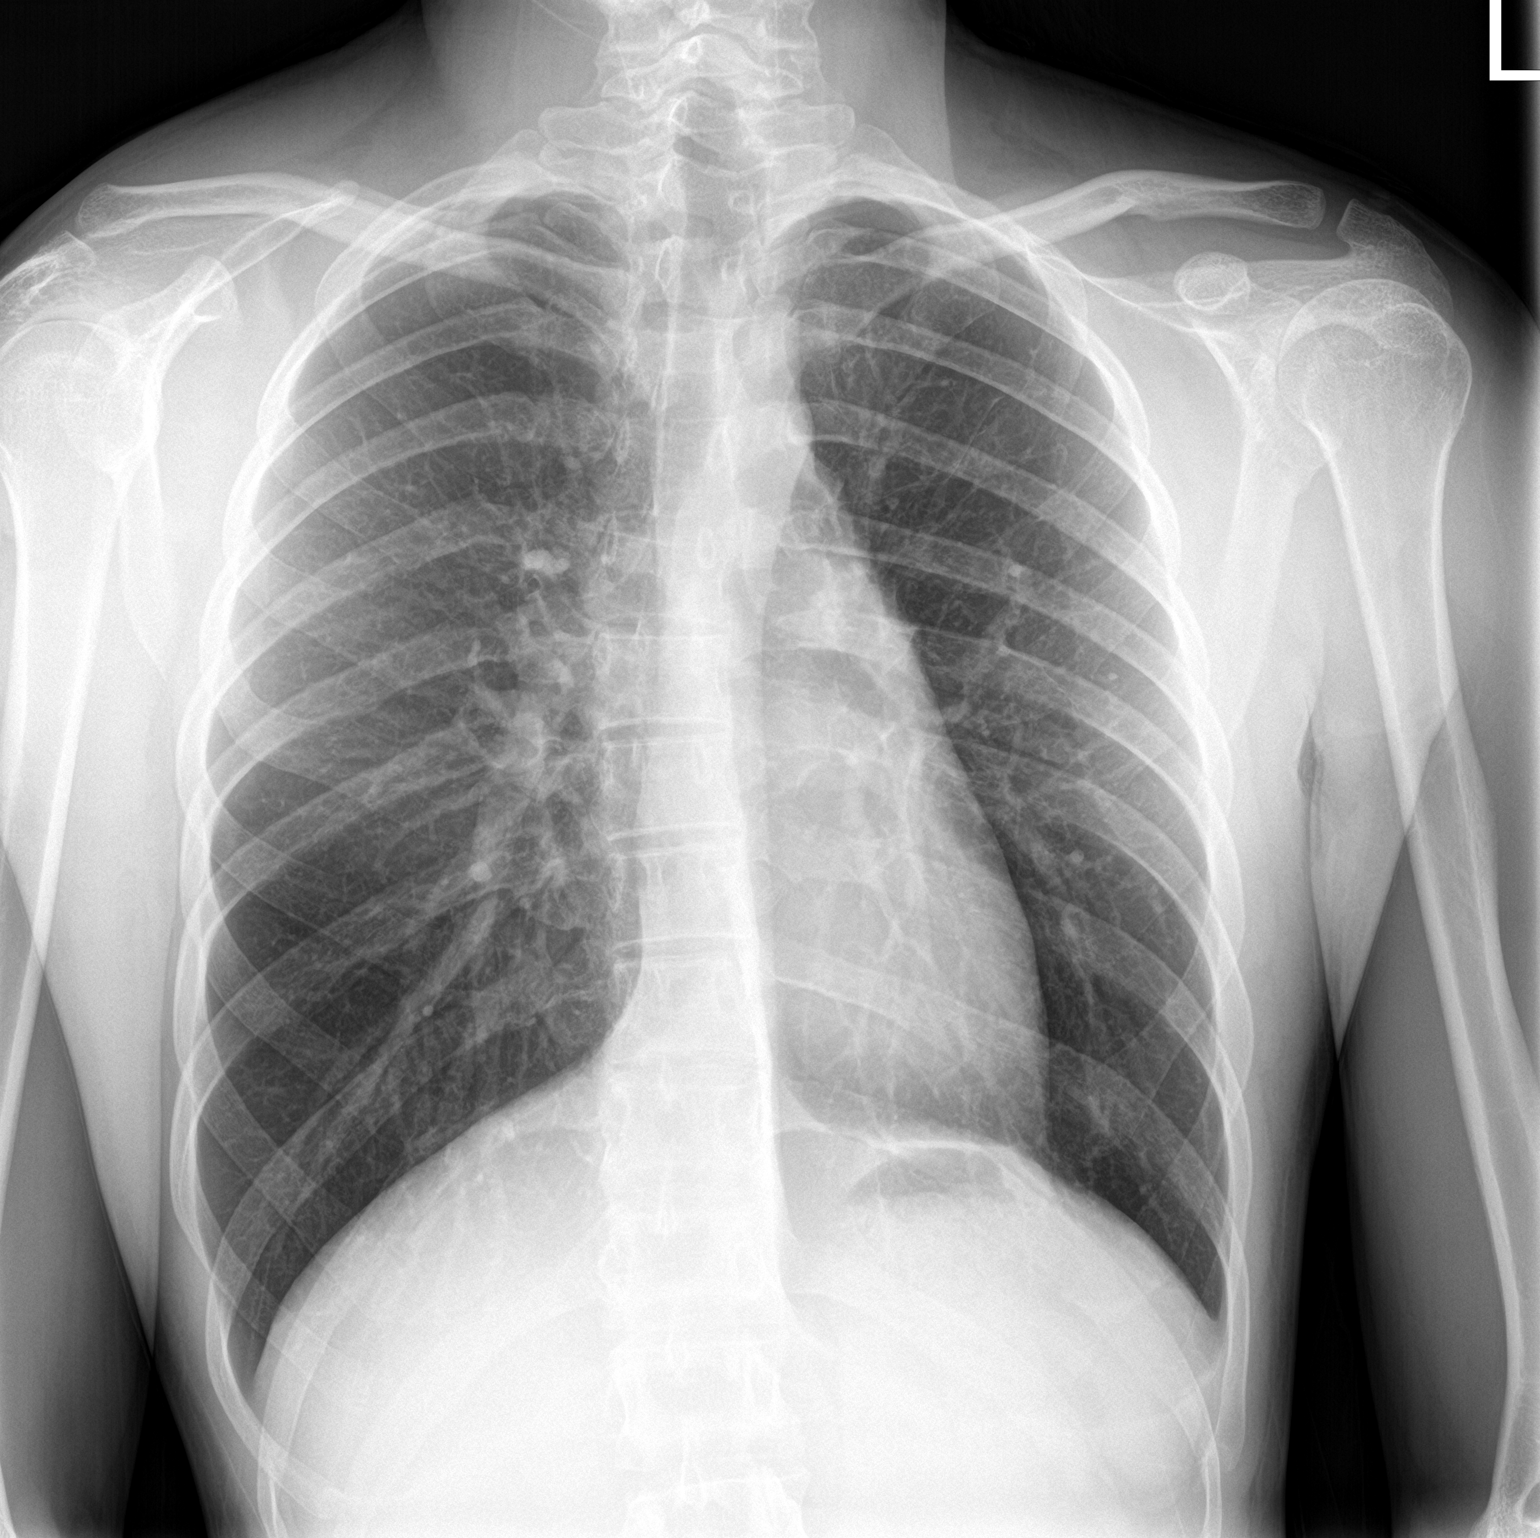

[chest lat]
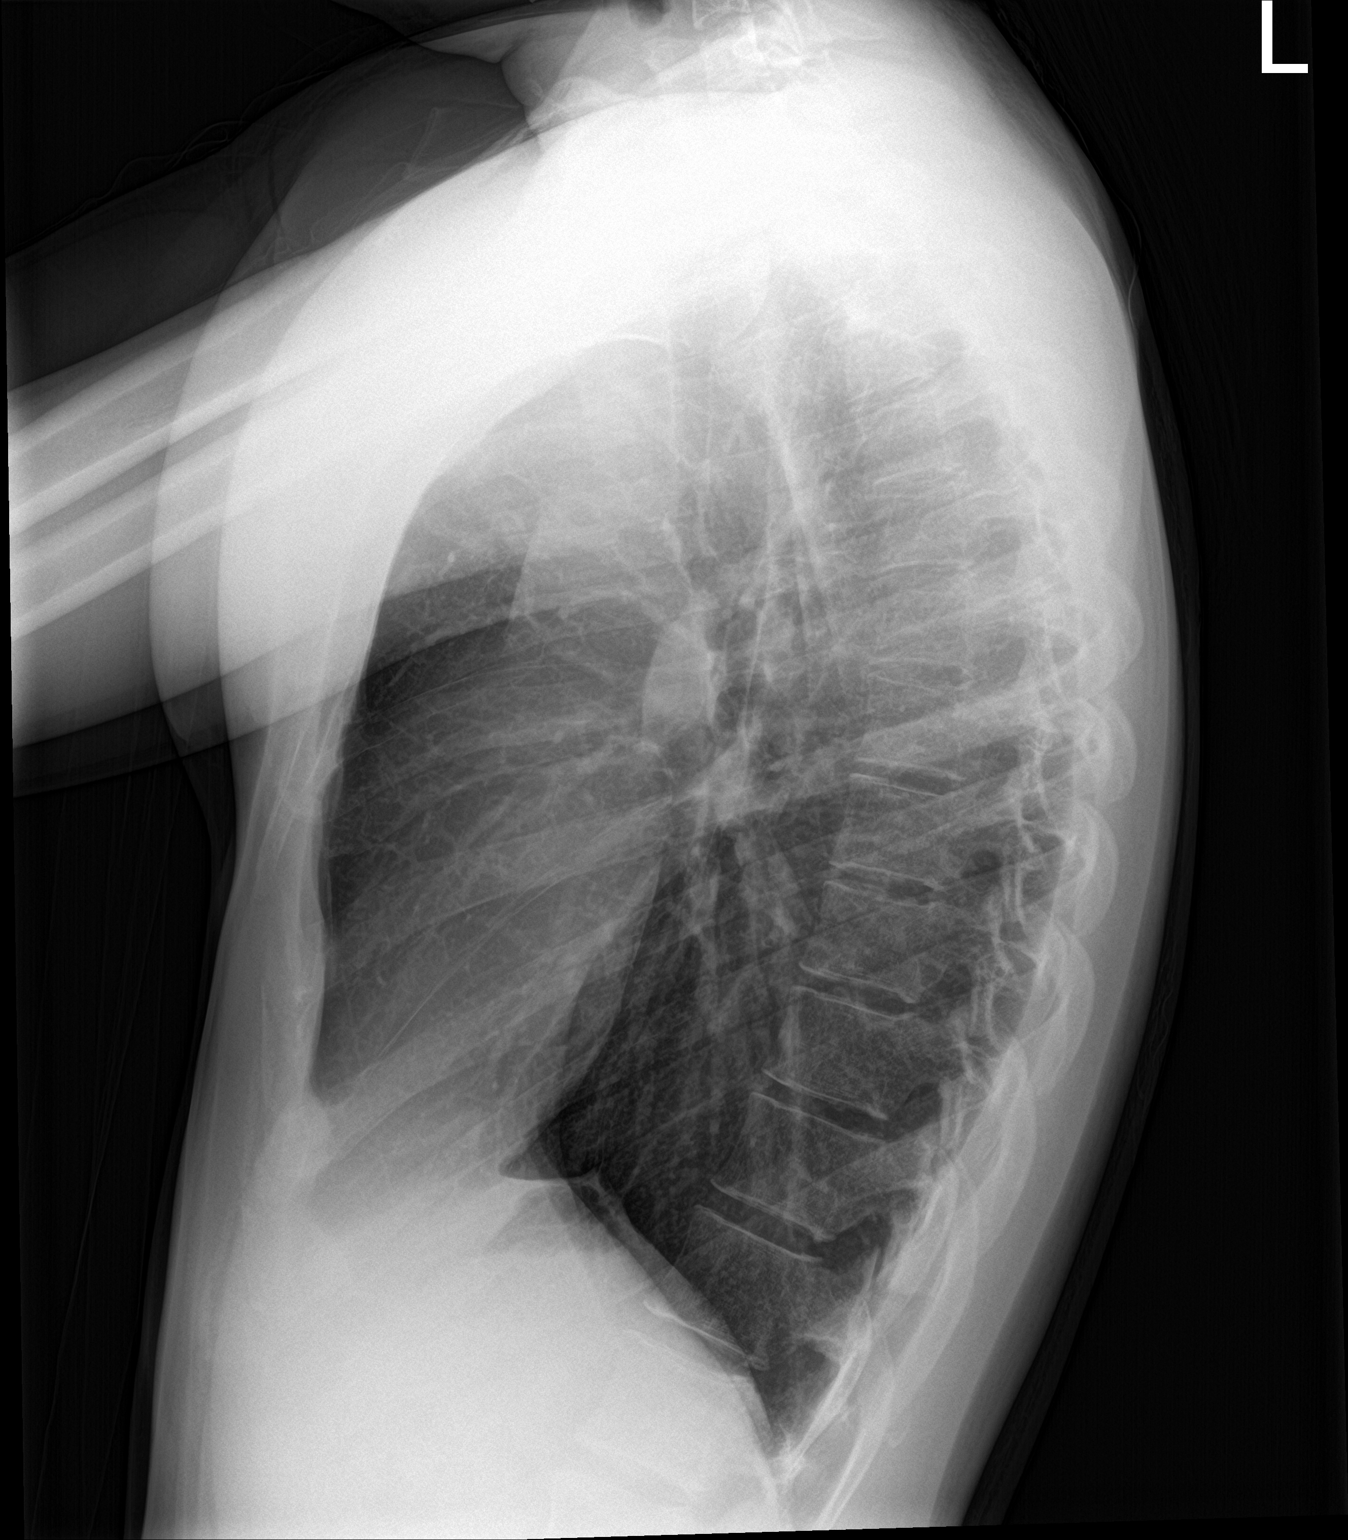

[2 of 2 positions shown; findings below may reference images not displayed]

FINDINGS: The heart size and mediastinal contours are within normal limits.
Both lungs are clear. The visualized skeletal structures are
unremarkable.
IMPRESSION: No active cardiopulmonary disease.
